# Patient Record
Sex: Male | Born: 1960
Health system: Southern US, Community
[De-identification: ages and names within clinical notes are randomized; demographics above are authoritative.]

## PROBLEM LIST (undated history)

## (undated) DIAGNOSIS — J45909 Unspecified asthma, uncomplicated: Secondary | ICD-10-CM

## (undated) HISTORY — PX: JOINT REPLACEMENT: SHX530

---

## 2003-06-05 ENCOUNTER — Ambulatory Visit (HOSPITAL_COMMUNITY): Admission: RE | Admit: 2003-06-05 | Discharge: 2003-06-05 | Payer: Self-pay | Admitting: Gastroenterology

## 2010-11-23 ENCOUNTER — Encounter: Payer: Self-pay | Admitting: Surgery

## 2016-01-31 ENCOUNTER — Emergency Department (HOSPITAL_BASED_OUTPATIENT_CLINIC_OR_DEPARTMENT_OTHER)
Admission: EM | Admit: 2016-01-31 | Discharge: 2016-01-31 | Disposition: A | Discharge status: Home / Self Care | Payer: BC Managed Care – PPO | Attending: Emergency Medicine | Admitting: Emergency Medicine

## 2016-01-31 ENCOUNTER — Emergency Department (HOSPITAL_BASED_OUTPATIENT_CLINIC_OR_DEPARTMENT_OTHER): Payer: BC Managed Care – PPO

## 2016-01-31 ENCOUNTER — Encounter (HOSPITAL_BASED_OUTPATIENT_CLINIC_OR_DEPARTMENT_OTHER): Payer: Self-pay | Admitting: Emergency Medicine

## 2016-01-31 DIAGNOSIS — J45901 Unspecified asthma with (acute) exacerbation: Secondary | ICD-10-CM | POA: Insufficient documentation

## 2016-01-31 DIAGNOSIS — Z7952 Long term (current) use of systemic steroids: Secondary | ICD-10-CM | POA: Insufficient documentation

## 2016-01-31 DIAGNOSIS — Z79899 Other long term (current) drug therapy: Secondary | ICD-10-CM | POA: Diagnosis not present

## 2016-01-31 DIAGNOSIS — Z7951 Long term (current) use of inhaled steroids: Secondary | ICD-10-CM | POA: Insufficient documentation

## 2016-01-31 DIAGNOSIS — R0602 Shortness of breath: Secondary | ICD-10-CM | POA: Diagnosis present

## 2016-01-31 DIAGNOSIS — R42 Dizziness and giddiness: Secondary | ICD-10-CM | POA: Insufficient documentation

## 2016-01-31 HISTORY — DX: Unspecified asthma, uncomplicated: J45.909

## 2016-01-31 LAB — BASIC METABOLIC PANEL
Anion gap: 10 (ref 5–15)
BUN: 25 mg/dL — AB (ref 6–20)
CO2: 21 mmol/L — ABNORMAL LOW (ref 22–32)
CREATININE: 0.93 mg/dL (ref 0.61–1.24)
Calcium: 9.1 mg/dL (ref 8.9–10.3)
Chloride: 106 mmol/L (ref 101–111)
GFR calc Af Amer: >60 mL/min (ref >60)
Glucose, Bld: 102 mg/dL — ABNORMAL HIGH (ref 65–99)
Potassium: 3.8 mmol/L (ref 3.5–5.1)
SODIUM: 137 mmol/L (ref 135–145)

## 2016-01-31 LAB — CBC
HCT: 44.3 % (ref 39.0–52.0)
Hemoglobin: 15.3 g/dL (ref 13.0–17.0)
MCH: 29.4 pg (ref 26.0–34.0)
MCHC: 34.5 g/dL (ref 30.0–36.0)
MCV: 85 fL (ref 78.0–100.0)
PLATELETS: 287 10*3/uL (ref 150–400)
RBC: 5.21 MIL/uL (ref 4.22–5.81)
RDW: 12.8 % (ref 11.5–15.5)
WBC: 13 10*3/uL — ABNORMAL HIGH (ref 4.0–10.5)

## 2016-01-31 LAB — TROPONIN I

## 2016-01-31 MED ORDER — ALBUTEROL (5 MG/ML) CONTINUOUS INHALATION SOLN
INHALATION_SOLUTION | RESPIRATORY_TRACT | Status: AC
  Administered 2016-01-31: 15 mg/h
  Filled 2016-01-31: qty 20

## 2016-01-31 MED ORDER — SODIUM CHLORIDE 0.9 % IV SOLN
INTRAVENOUS | Status: DC
  Administered 2016-01-31: 11:00:00 via INTRAVENOUS

## 2016-01-31 MED ORDER — PREDNISONE 10 MG PO TABS: 40.0000 mg | ORAL_TABLET | Freq: Every day | ORAL | Status: DC

## 2016-01-31 MED ORDER — IPRATROPIUM-ALBUTEROL 0.5-2.5 (3) MG/3ML IN SOLN
3.0000 mL | Freq: Four times a day (QID) | RESPIRATORY_TRACT | Status: DC
  Administered 2016-01-31: 3 mL via RESPIRATORY_TRACT
  Filled 2016-01-31: qty 3

## 2016-01-31 MED ORDER — METHYLPREDNISOLONE SODIUM SUCC 125 MG IJ SOLR
125.0000 mg | Freq: Once | INTRAMUSCULAR | Status: AC
  Administered 2016-01-31: 125 mg via INTRAVENOUS
  Filled 2016-01-31: qty 2

## 2016-01-31 MED FILL — predniSONE 10 MG TABS: 10 | 5 days supply | Qty: 20 | Fill #0

## 2016-01-31 NOTE — ED Notes (Signed)
[facility] brought from [facility], where patient was having a meeting and suddenly developed acute onset of sob, pt has been having inreased issues with Asthma due to pollen, wed seen at pcp started on steroids, albuterol and antibiotic

## 2016-01-31 NOTE — Discharge Instructions (Signed)
Asthma, Adult Asthma is a condition of the lungs in which the airways tighten and narrow. Asthma can make it hard to breathe. Asthma cannot be cured, but medicine and lifestyle changes can help control it. Asthma may be started (triggered) by:  Animal skin flakes (dander).  Dust.  Cockroaches.  Pollen.  Mold.  Smoke.  Cleaning products.  Hair sprays or aerosol sprays.  Paint fumes or strong smells.  Cold air, weather changes, and winds.  Crying or laughing hard.  Stress.  Certain medicines or drugs.  Foods, such as dried fruit, potato chips, and sparkling grape juice.  Infections or conditions (colds, flu).  Exercise.  Certain medical conditions or diseases.  Exercise or tiring activities. HOME CARE   Take medicine as told by your doctor.  Use a peak flow meter as told by your doctor. A peak flow meter is a tool that measures how well the lungs are working.  Record and keep track of the peak flow meter's readings.  Understand and use the asthma action plan. An asthma action plan is a written plan for taking care of your asthma and treating your attacks.  To help prevent asthma attacks:  Do not smoke. Stay away from secondhand smoke.  Change your heating and air conditioning filter often.  Limit your use of fireplaces and wood stoves.  Get rid of pests (such as roaches and mice) and their droppings.  Throw away plants if you see mold on them.  Clean your floors. Dust regularly. Use cleaning products that do not smell.  Have someone vacuum when you are not home. Use a vacuum cleaner with a HEPA filter if possible.  Replace carpet with wood, tile, or vinyl flooring. Carpet can trap animal skin flakes and dust.  Use allergy-proof pillows, mattress covers, and box spring covers.  Wash bed sheets and blankets every week in hot water and dry them in a dryer.  Use blankets that are made of polyester or cotton.  Clean bathrooms and kitchens with bleach.  If possible, have someone repaint the walls in these rooms with mold-resistant paint. Keep out of the rooms that are being cleaned and painted.  Wash hands often. GET HELP IF:  You have make a whistling sound when breaking (wheeze), have shortness of breath, or have a cough even if taking medicine to prevent attacks.  The colored mucus you cough up (sputum) is thicker than usual.  The colored mucus you cough up changes from clear or white to yellow, green, gray, or bloody.  You have problems from the medicine you are taking such as:  A rash.  Itching.  Swelling.  Trouble breathing.  You need reliever medicines more than 2-3 times a week.  Your peak flow measurement is still at 50-79% of your personal best after following the action plan for 1 hour.  You have a fever. GET HELP RIGHT AWAY IF:   You seem to be worse and are not responding to medicine during an asthma attack.  You are short of breath even at rest.  You get short of breath when doing very little activity.  You have trouble eating, drinking, or talking.  You have chest pain.  You have a fast heartbeat.  Your lips or fingernails start to turn blue.  You are light-headed, dizzy, or faint.  Your peak flow is less than 50% of your personal best.   This information is not intended to replace advice given to you by your health care provider. Make sure  you discuss any questions you have with your health care provider.  Use albuterol inhaler that you have at home 2 puffs every 6 hours for the next 7 days. Take prednisone as directed for the next 5 days. Make an appointment to follow-up with your regular doctor. Return for any new or worse symptoms.   Document Released: 04/06/2008 Document Revised: 07/10/2015 Document Reviewed: 05/18/2013 Elsevier Interactive Patient Education [employer].

## 2016-01-31 NOTE — ED Notes (Signed)
Ems did not give solumedrol en route secondary patient took dose of prednisone this am, according to patient he only took 5 mg. 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

## 2016-01-31 NOTE — ED Notes (Signed)
Family at bedside. 

## 2016-01-31 NOTE — ED Provider Notes (Signed)
CSN: [id number]     Arrival date & time 01/31/16  [id number] History   First MD Initiated Contact with Patient 01/31/16 [phone]     Chief Complaint  Patient presents with  . Shortness of Breath     (Consider location/radiation/quality/duration/timing/severity/associated sxs/prior Treatment) Patient is a 55 y.o. male presenting with shortness of breath. The history is provided by the patient, the spouse and the EMS personnel.  Shortness of Breath Associated symptoms: wheezing   Associated symptoms: no abdominal pain, no chest pain and no vomiting    D86-year-old male brought in by EMS for acute exacerbation of shortness of breath and wheezing while at work at [employer]. Patient given albuterol nebulizer in route. Patient still very short of breath upon arrival with bilateral wheezing. Patient has a history of allergies and asthma. Patient's just finishing up a steroid taper for asthma exacerbation of about a week and a half ago. Patient also chest x-ray at that time that was negative. Patient uses albuterol inhaler at home only 1 puff as needed.  Past Medical History  Diagnosis Date  . Asthma    Past Surgical History  Procedure Laterality Date  . Joint replacement     History reviewed. No pertinent family history. Social History  Substance Use Topics  . Smoking status: Never Smoker   . Smokeless tobacco: None  . Alcohol Use: No    Review of Systems  Constitutional: Positive for fatigue.  HENT: Positive for congestion.   Eyes: Negative for visual disturbance.  Respiratory: Positive for shortness of breath and wheezing.   Cardiovascular: Negative for chest pain.  Gastrointestinal: Negative for nausea, vomiting and abdominal pain.  Genitourinary: Negative for dysuria.  Musculoskeletal: Negative for back pain.  Neurological: Positive for light-headedness. Negative for speech difficulty.  Hematological: Does not bruise/bleed easily.  Psychiatric/Behavioral: Negative for  confusion.      Allergies  Review of patient's allergies indicates no known allergies.  Home Medications   Prior to Admission medications   Medication Sig Start Date End Date Taking? Authorizing Provider  cetirizine (ZYRTEC) 10 MG tablet Take 10 mg by mouth daily.   Yes Historical Provider, MD  fluticasone (FLOVENT HFA) 110 MCG/ACT inhaler Inhale into the lungs 2 (two) times daily.   Yes Historical Provider, MD  predniSONE (STERAPRED UNI-PAK 21 TAB) 10 MG (21) TBPK tablet Take 10 mg by mouth daily.   Yes Historical Provider, MD  UNKNOWN TO PATIENT    Yes Historical Provider, MD  predniSONE (DELTASONE) 10 MG tablet Take 4 tablets (40 mg total) by mouth daily. 01/31/16   Fredia Sorrow, MD   BP 138/76 mmHg  Pulse 89  Temp(Src) 98.2 F (36.8 C) (Oral)  Resp 16  Ht 6\' 2"  (1.88 m)  Wt 104.327 kg  BMI 29.52 kg/m2  SpO2 97% Physical Exam  Constitutional: He is oriented to person, place, and time. He appears well-developed and well-nourished. No distress.  HENT:  Head: Normocephalic and atraumatic.  Mouth/Throat: Oropharynx is clear and moist.  Eyes: Conjunctivae and EOM are normal. Pupils are equal, round, and reactive to light.  Neck: Normal range of motion. Neck supple.  Cardiovascular: Normal rate, regular rhythm, normal heart sounds and intact distal pulses.   Pulmonary/Chest: He is in respiratory distress. He has wheezes. He has no rales.  Abdominal: Soft. Bowel sounds are normal. There is no tenderness.  Musculoskeletal: He exhibits no edema.  Neurological: He is alert and oriented to person, place, and time. No cranial nerve deficit. He exhibits  normal muscle tone. Coordination normal.  Skin: Skin is warm. No rash noted.  Nursing note and vitals reviewed.   ED Course  Procedures (including critical care time) Labs Review Labs Reviewed  CBC - Abnormal; Notable for the following:    WBC 13.0 (*)    All other components within normal limits  BASIC METABOLIC PANEL -  Abnormal; Notable for the following:    CO2 21 (*)    Glucose, Bld 102 (*)    BUN 25 (*)    All other components within normal limits  TROPONIN I   Results for orders placed or performed during the hospital encounter of 01/31/16  CBC  Result Value Ref Range   WBC 13.0 (H) 4.0 - 10.5 K/uL   RBC 5.21 4.22 - 5.81 MIL/uL   Hemoglobin 15.3 13.0 - 17.0 g/dL   HCT 44.3 39.0 - 52.0 %   MCV 85.0 78.0 - 100.0 fL   MCH 29.4 26.0 - 34.0 pg   MCHC 34.5 30.0 - 36.0 g/dL   RDW 12.8 11.5 - 15.5 %   Platelets 287 150 - 400 K/uL  Basic metabolic panel  Result Value Ref Range   Sodium 137 135 - 145 mmol/L   Potassium 3.8 3.5 - 5.1 mmol/L   Chloride 106 101 - 111 mmol/L   CO2 21 (L) 22 - 32 mmol/L   Glucose, Bld 102 (H) 65 - 99 mg/dL   BUN 25 (H) 6 - 20 mg/dL   Creatinine, Ser 0.93 0.61 - 1.24 mg/dL   Calcium 9.1 8.9 - 10.3 mg/dL   GFR calc non Af Amer >60 >60 mL/min   GFR calc Af Amer >60 >60 mL/min   Anion gap 10 5 - 15  Troponin I  Result Value Ref Range   Troponin I <0.03 <0.031 ng/mL     Imaging Review Dg Chest 2 View  01/31/2016  CLINICAL DATA:  Shortness of breath, wheezing EXAM: CHEST  2 VIEW COMPARISON:  None. FINDINGS: Cardiomediastinal silhouette is unremarkable. No acute infiltrate or pleural effusion. No pulmonary edema. Bony thorax is unremarkable. IMPRESSION: No active cardiopulmonary disease. Electronically Signed   By: Lahoma Crocker M.D.   On: 01/31/2016 10:03   I have personally reviewed and evaluated these images and lab results as part of my medical decision-making.   EKG Interpretation   Date/Time:  Friday January 31 2016 09:31:39 EDT Ventricular Rate:  65 PR Interval:  119 QRS Duration: 96 QT Interval:  411 QTC Calculation: 427 R Axis:   37 Text Interpretation:  Sinus rhythm Borderline short PR interval Abnormal  R-wave progression, early transition Minimal ST elevation, inferior leads  No previous ECGs available ] Confirmed by Diana Armijo  MD, Christiano Blandon [phone]) on   01/31/2016 10:07:15 AM      MDM   Final diagnoses:  Asthma, unspecified asthma severity, with acute exacerbation    Patient with known history of asthma and allergies. Patient with an acute exacerbation of wheezing and shortness of breath today. Patient is just finishing up a steroid taper. Patient uses an albuterol inhaler at home. EMS gave him albuterol nebulizer in route. Do not give any steroids. Here we repeated the albuterol nebulizer with Atrovent. And I gave a dose of 125 mg Solu-Medrol. Patient still wheezing after this. Patient given one-hour long belies her wheezing has resolved patient feels much better. Oxygen saturations are in the upper 90s and always have been. Patient feeling much better. Chest x-ray negative for pneumonia. Labs including a troponin without  any acute findings. Slight leukocytosis on the white blood cell count. This probably due to steroids.    Fredia Sorrow, MD 01/31/16 [phone]

## 2017-02-01 ENCOUNTER — Other Ambulatory Visit: Payer: Self-pay | Admitting: Allergy and Immunology

## 2017-02-01 ENCOUNTER — Ambulatory Visit
Admission: RE | Admit: 2017-02-01 | Discharge: 2017-02-01 | Disposition: A | Discharge status: Home / Self Care | Payer: BC Managed Care – PPO | Source: Ambulatory Visit | Attending: Allergy and Immunology | Admitting: Allergy and Immunology

## 2017-02-01 DIAGNOSIS — J209 Acute bronchitis, unspecified: Secondary | ICD-10-CM

## 2017-02-18 ENCOUNTER — Ambulatory Visit
Admission: RE | Admit: 2017-02-18 | Discharge: 2017-02-18 | Disposition: A | Discharge status: Home / Self Care | Payer: BC Managed Care – PPO | Source: Ambulatory Visit | Attending: Allergy and Immunology | Admitting: Allergy and Immunology

## 2017-02-18 ENCOUNTER — Other Ambulatory Visit: Payer: Self-pay | Admitting: Allergy and Immunology

## 2017-02-18 DIAGNOSIS — J4541 Moderate persistent asthma with (acute) exacerbation: Secondary | ICD-10-CM

## 2017-02-19 ENCOUNTER — Telehealth: Payer: Self-pay | Admitting: *Deleted

## 2017-02-19 NOTE — Telephone Encounter (Signed)
NOTES SENT TO SCHEDULING.  °

## 2017-03-01 ENCOUNTER — Emergency Department (HOSPITAL_BASED_OUTPATIENT_CLINIC_OR_DEPARTMENT_OTHER): Payer: BC Managed Care – PPO

## 2017-03-01 ENCOUNTER — Emergency Department (HOSPITAL_BASED_OUTPATIENT_CLINIC_OR_DEPARTMENT_OTHER)
Admission: EM | Admit: 2017-03-01 | Discharge: 2017-03-01 | Disposition: A | Discharge status: Home / Self Care | Payer: BC Managed Care – PPO | Attending: Emergency Medicine | Admitting: Emergency Medicine

## 2017-03-01 ENCOUNTER — Encounter (HOSPITAL_BASED_OUTPATIENT_CLINIC_OR_DEPARTMENT_OTHER): Payer: Self-pay | Admitting: *Deleted

## 2017-03-01 DIAGNOSIS — R0602 Shortness of breath: Secondary | ICD-10-CM | POA: Diagnosis present

## 2017-03-01 DIAGNOSIS — Z79899 Other long term (current) drug therapy: Secondary | ICD-10-CM | POA: Insufficient documentation

## 2017-03-01 DIAGNOSIS — J45901 Unspecified asthma with (acute) exacerbation: Secondary | ICD-10-CM | POA: Diagnosis not present

## 2017-03-01 LAB — BASIC METABOLIC PANEL
Anion gap: 12 (ref 5–15)
BUN: 19 mg/dL (ref 6–20)
CO2: 23 mmol/L (ref 22–32)
Calcium: 9.1 mg/dL (ref 8.9–10.3)
Chloride: 104 mmol/L (ref 101–111)
Creatinine, Ser: 1.01 mg/dL (ref 0.61–1.24)
GFR calc Af Amer: >60 mL/min (ref >60)
Glucose, Bld: 100 mg/dL — ABNORMAL HIGH (ref 65–99)
Potassium: 3.2 mmol/L — ABNORMAL LOW (ref 3.5–5.1)
SODIUM: 139 mmol/L (ref 135–145)

## 2017-03-01 LAB — CBC WITH DIFFERENTIAL/PLATELET
BASOS ABS: 0 10*3/uL (ref 0.0–0.1)
BASOS PCT: 0 %
EOS ABS: 0 10*3/uL (ref 0.0–0.7)
Eosinophils Relative: 0 %
HCT: 45.5 % (ref 39.0–52.0)
Hemoglobin: 15.7 g/dL (ref 13.0–17.0)
Lymphocytes Relative: 20 %
Lymphs Abs: 3.4 10*3/uL (ref 0.7–4.0)
MCH: 29.8 pg (ref 26.0–34.0)
MCHC: 34.5 g/dL (ref 30.0–36.0)
MCV: 86.3 fL (ref 78.0–100.0)
Monocytes Absolute: 1.1 10*3/uL — ABNORMAL HIGH (ref 0.1–1.0)
Monocytes Relative: 6 %
NEUTROS PCT: 74 %
Neutro Abs: 12.8 10*3/uL — ABNORMAL HIGH (ref 1.7–7.7)
PLATELETS: 252 10*3/uL (ref 150–400)
RBC: 5.27 MIL/uL (ref 4.22–5.81)
RDW: 13.7 % (ref 11.5–15.5)
WBC: 17.4 10*3/uL — AB (ref 4.0–10.5)

## 2017-03-01 LAB — TROPONIN I

## 2017-03-01 LAB — D-DIMER, QUANTITATIVE: D-Dimer, Quant: 0.37 ug/mL-FEU (ref 0.00–0.50)

## 2017-03-01 MED ORDER — PREDNISONE 20 MG PO TABS: ORAL_TABLET | ORAL | 0 refills | Status: DC

## 2017-03-01 MED ORDER — TIOTROPIUM BROMIDE MONOHYDRATE 1.25 MCG/ACT IN AERS: 1.2500 ug | INHALATION_SPRAY | Freq: Every day | RESPIRATORY_TRACT | 0 refills | Status: DC

## 2017-03-01 MED ORDER — PREDNISONE 50 MG PO TABS
60.0000 mg | ORAL_TABLET | Freq: Once | ORAL | Status: DC
  Filled 2017-03-01: qty 1

## 2017-03-01 MED ORDER — IPRATROPIUM-ALBUTEROL 0.5-2.5 (3) MG/3ML IN SOLN
3.0000 mL | RESPIRATORY_TRACT | Status: AC
  Administered 2017-03-01 (×3): 3 mL via RESPIRATORY_TRACT
  Filled 2017-03-01: qty 6
  Filled 2017-03-01: qty 3

## 2017-03-01 NOTE — ED Notes (Signed)
Into dc patient, patient not in room, EMT previously removed IV access, discharge papers not available - Dr. Dayna Barker states he reviewed discharge plan with patient - discharge papers and pt not available for RN review at discharge.

## 2017-03-01 NOTE — ED Notes (Signed)
Family at bedside. 

## 2017-03-01 NOTE — ED Triage Notes (Signed)
Dr. Dolly Rias at bedside to assess pt during triage. Pt's wife reports pt has a history of asthma and has recently had pneumonia. He became SOB at work and was brought here.

## 2017-03-01 NOTE — ED Provider Notes (Signed)
[facility] DEPT Provider Note   CSN: [id number] Arrival date & time: 03/01/17  1218     History   Chief Complaint Chief Complaint  Patient presents with  . Shortness of Breath    HPI Noah Carrillo is a 56 y.o. male.   Shortness of Breath  This is a recurrent problem. The average episode lasts 2 hours. The problem occurs continuously.The problem has been gradually worsening. Associated symptoms include cough and chest pain. Pertinent negatives include no fever. The problem's precipitants include pollens. He has tried nothing for the symptoms. Associated medical issues include asthma.    Past Medical History:  Diagnosis Date  . Asthma     There are no active problems to display for this patient.   No past surgical history on file.     Home Medications    Prior to Admission medications   Medication Sig Start Date End Date Taking? Authorizing Provider  cetirizine (ZYRTEC) 10 MG tablet Take 10 mg by mouth daily.   Yes Historical Provider, MD  fluticasone (FLOVENT HFA) 110 MCG/ACT inhaler Inhale into the lungs 2 (two) times daily.   Yes Historical Provider, MD  predniSONE (DELTASONE) 20 MG tablet 3 tabs po daily x 3 days, then 2 tabs x 3 days, then 1.5 tabs x 3 days, then 1 tab x 3 days, then 0.5 tabs x 3 days 03/01/17   Merrily Pew, MD  Tiotropium Bromide Monohydrate (SPIRIVA RESPIMAT) 1.25 MCG/ACT AERS Inhale 1.25 mcg into the lungs daily. 03/01/17   Merrily Pew, MD  UNKNOWN TO PATIENT     Historical Provider, MD    Family History No family history on file.  Social History Social History  Substance Use Topics  . Smoking status: Never Smoker  . Smokeless tobacco: Never Used  . Alcohol use No     Allergies   Patient has no known allergies.   Review of Systems Review of Systems  Constitutional: Negative for fever.  Respiratory: Positive for cough and shortness of breath.   Cardiovascular: Positive for chest pain.  All other systems reviewed and are  negative.    Physical Exam Updated Vital Signs BP (!) 146/70   Pulse 81   Temp 98.1 F (36.7 C) (Oral)   Resp 10   SpO2 97%   Physical Exam  Constitutional: He appears well-developed and well-nourished. No distress.  HENT:  Head: Normocephalic and atraumatic.  Eyes: Conjunctivae and EOM are normal.  Neck: Normal range of motion.  Cardiovascular: Normal rate.   Pulmonary/Chest: Effort normal. No respiratory distress.  Abdominal: Soft. He exhibits no distension.  Musculoskeletal: Normal range of motion.  Neurological: He is alert.  Nursing note and vitals reviewed.    ED Treatments / Results  Labs (all labs ordered are listed, but only abnormal results are displayed) Labs Reviewed  CBC WITH DIFFERENTIAL/PLATELET - Abnormal; Notable for the following:       Result Value   WBC 17.4 (*)    Neutro Abs 12.8 (*)    Monocytes Absolute 1.1 (*)    All other components within normal limits  BASIC METABOLIC PANEL - Abnormal; Notable for the following:    Potassium 3.2 (*)    Glucose, Bld 100 (*)    All other components within normal limits  TROPONIN I  D-DIMER, QUANTITATIVE (NOT AT [facility])    EKG  EKG Interpretation  Date/Time:  Monday March 01 2017 13:17:58 EDT Ventricular Rate:  83 PR Interval:    QRS Duration: 97 QT Interval:  384 QTC Calculation: 452 R Axis:   12 Text Interpretation:  Sinus rhythm No significant change since last tracing Confirmed by [facility] MD, Jinnifer Montejano [phone]) on 03/01/2017 2:02:10 PM       Radiology Dg Chest Portable 1 View  Result Date: 03/01/2017 CLINICAL DATA:  Onset shortness of breath today. EXAM: PORTABLE CHEST 1 VIEW COMPARISON:  PA and lateral chest 02/18/2017 and 02/01/2017. FINDINGS: The lungs are clear. Heart size is normal. No pneumothorax or pleural effusion. No acute bony abnormality. IMPRESSION: No acute disease. Electronically Signed   By: Inge Rise M.D.   On: 03/01/2017 12:59    Procedures Procedures (including critical  care time)  Medications Ordered in ED Medications  ipratropium-albuterol (DUONEB) 0.5-2.5 (3) MG/3ML nebulizer solution 3 mL (3 mLs Nebulization Given 03/01/17 1325)     Initial Impression / Assessment and Plan / ED Course  I have reviewed the triage vital signs and the nursing notes.  Pertinent labs & imaging results that were available during my care of the patient were reviewed by me and considered in my medical decision making (see chart for details).     Patient with apparent asthma attack that seemed to improve with duo nebs and steroids. I discussed with his allergist, Dr. Fredderick Phenix, who recommends starting Spiriva and doing prolonged prednisone taper. He says he will see him in the office then this week in the next week and the patient should call for an appointment. Patient with subjective improvement after breathing treatments. Able to ambulate without dyspnea. Stable for discharge.  Final Clinical Impressions(s) / ED Diagnoses   Final diagnoses:  Exacerbation of asthma, unspecified asthma severity, unspecified whether persistent    New Prescriptions Discharge Medication List as of 03/01/2017  2:49 PM    START taking these medications   Details  Tiotropium Bromide Monohydrate (SPIRIVA RESPIMAT) 1.25 MCG/ACT AERS Inhale 1.25 mcg into the lungs daily., Starting Mon 03/01/2017, Print         Merrily Pew, MD 03/02/17 [phone]

## 2017-03-01 NOTE — ED Notes (Signed)
Xray at bedside for portable chest 

## 2017-03-01 NOTE — ED Notes (Signed)
RT at bedside assessing patient  

## 2017-12-01 IMAGING — CR DG CHEST 2V
2 series · 2 of 2 positions shown · non-contrast
Comparison: 02/01/2017 chest radiograph.

CLINICAL DATA: Moderate persistent asthma

EXAM:
CHEST  2 VIEW

[w chest pa]
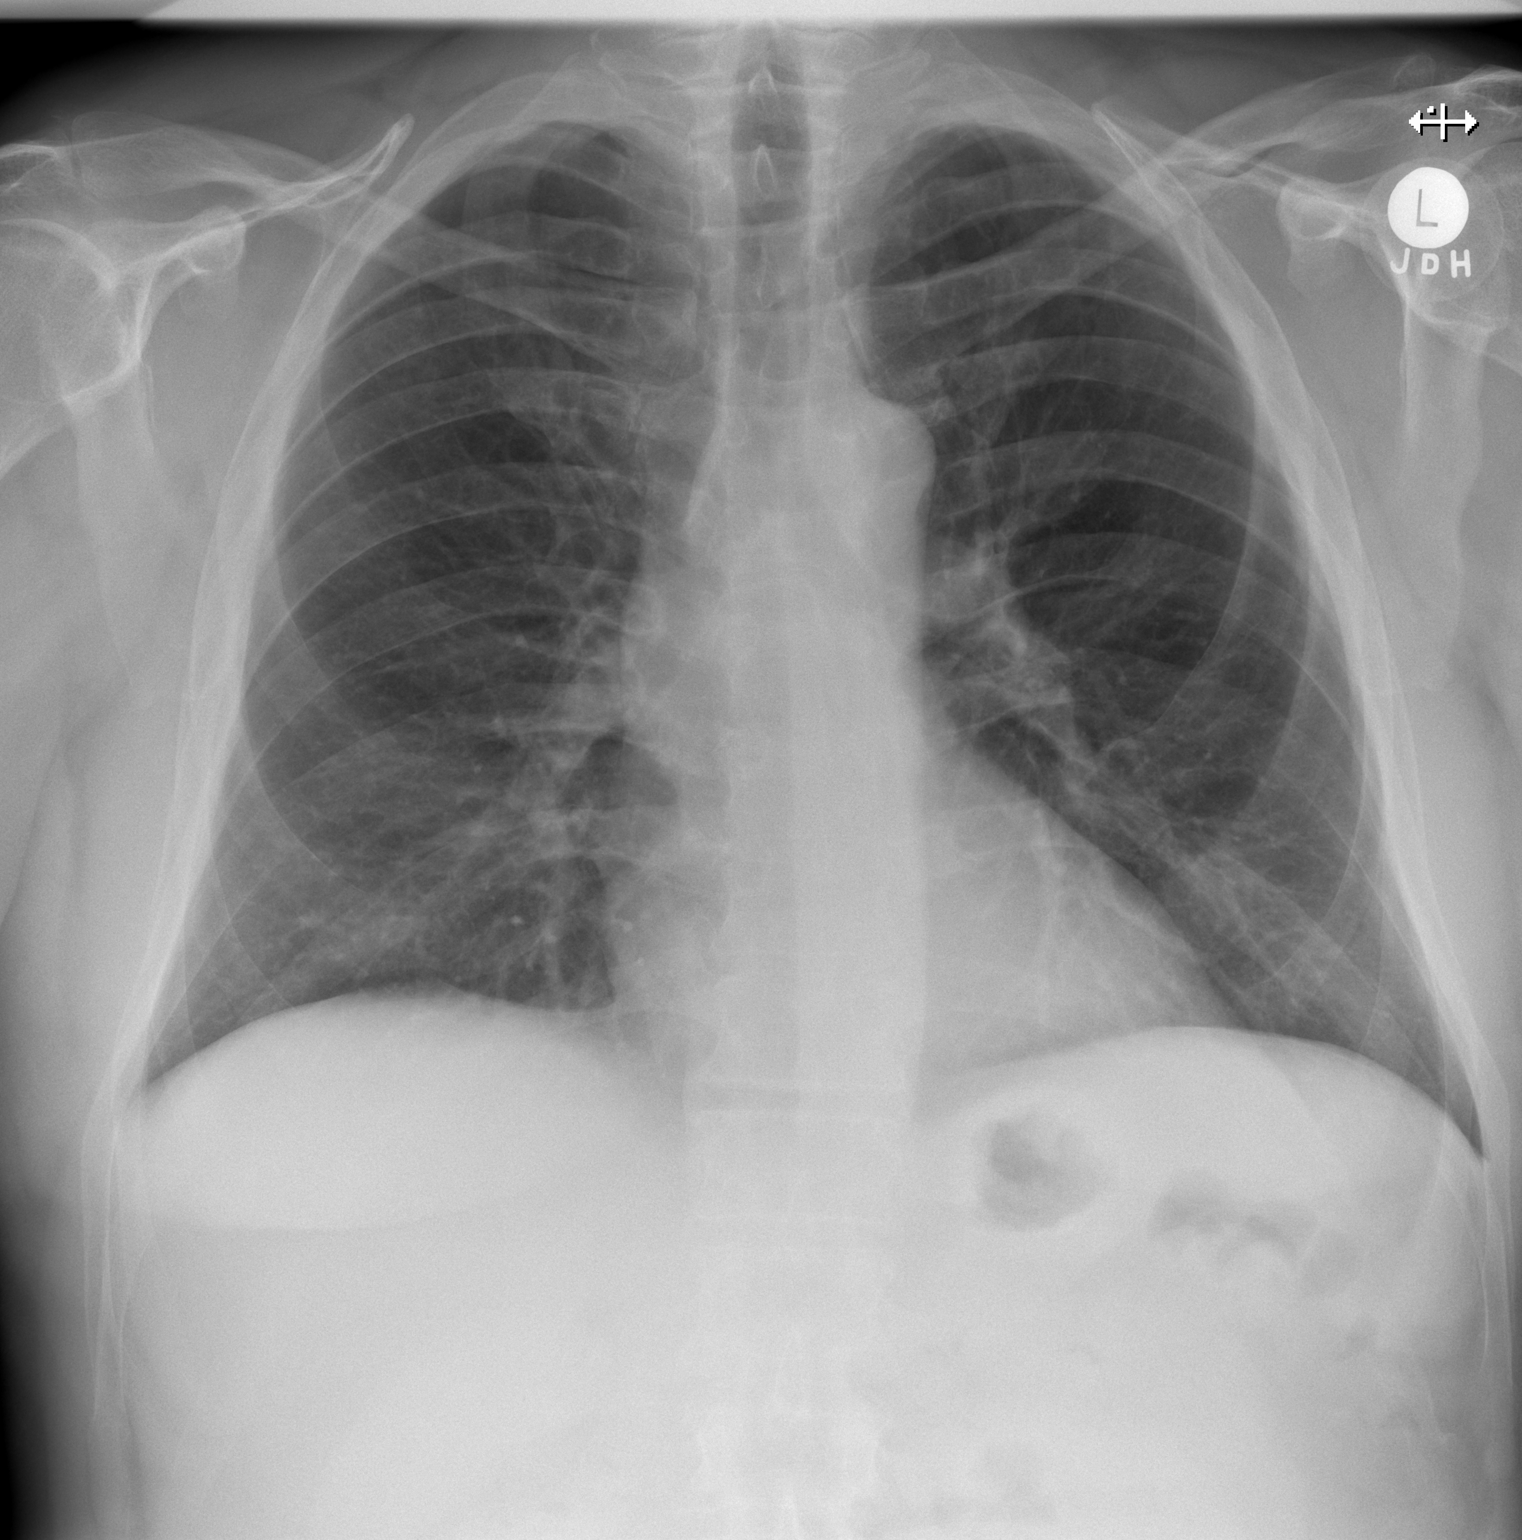

[w chest lat]
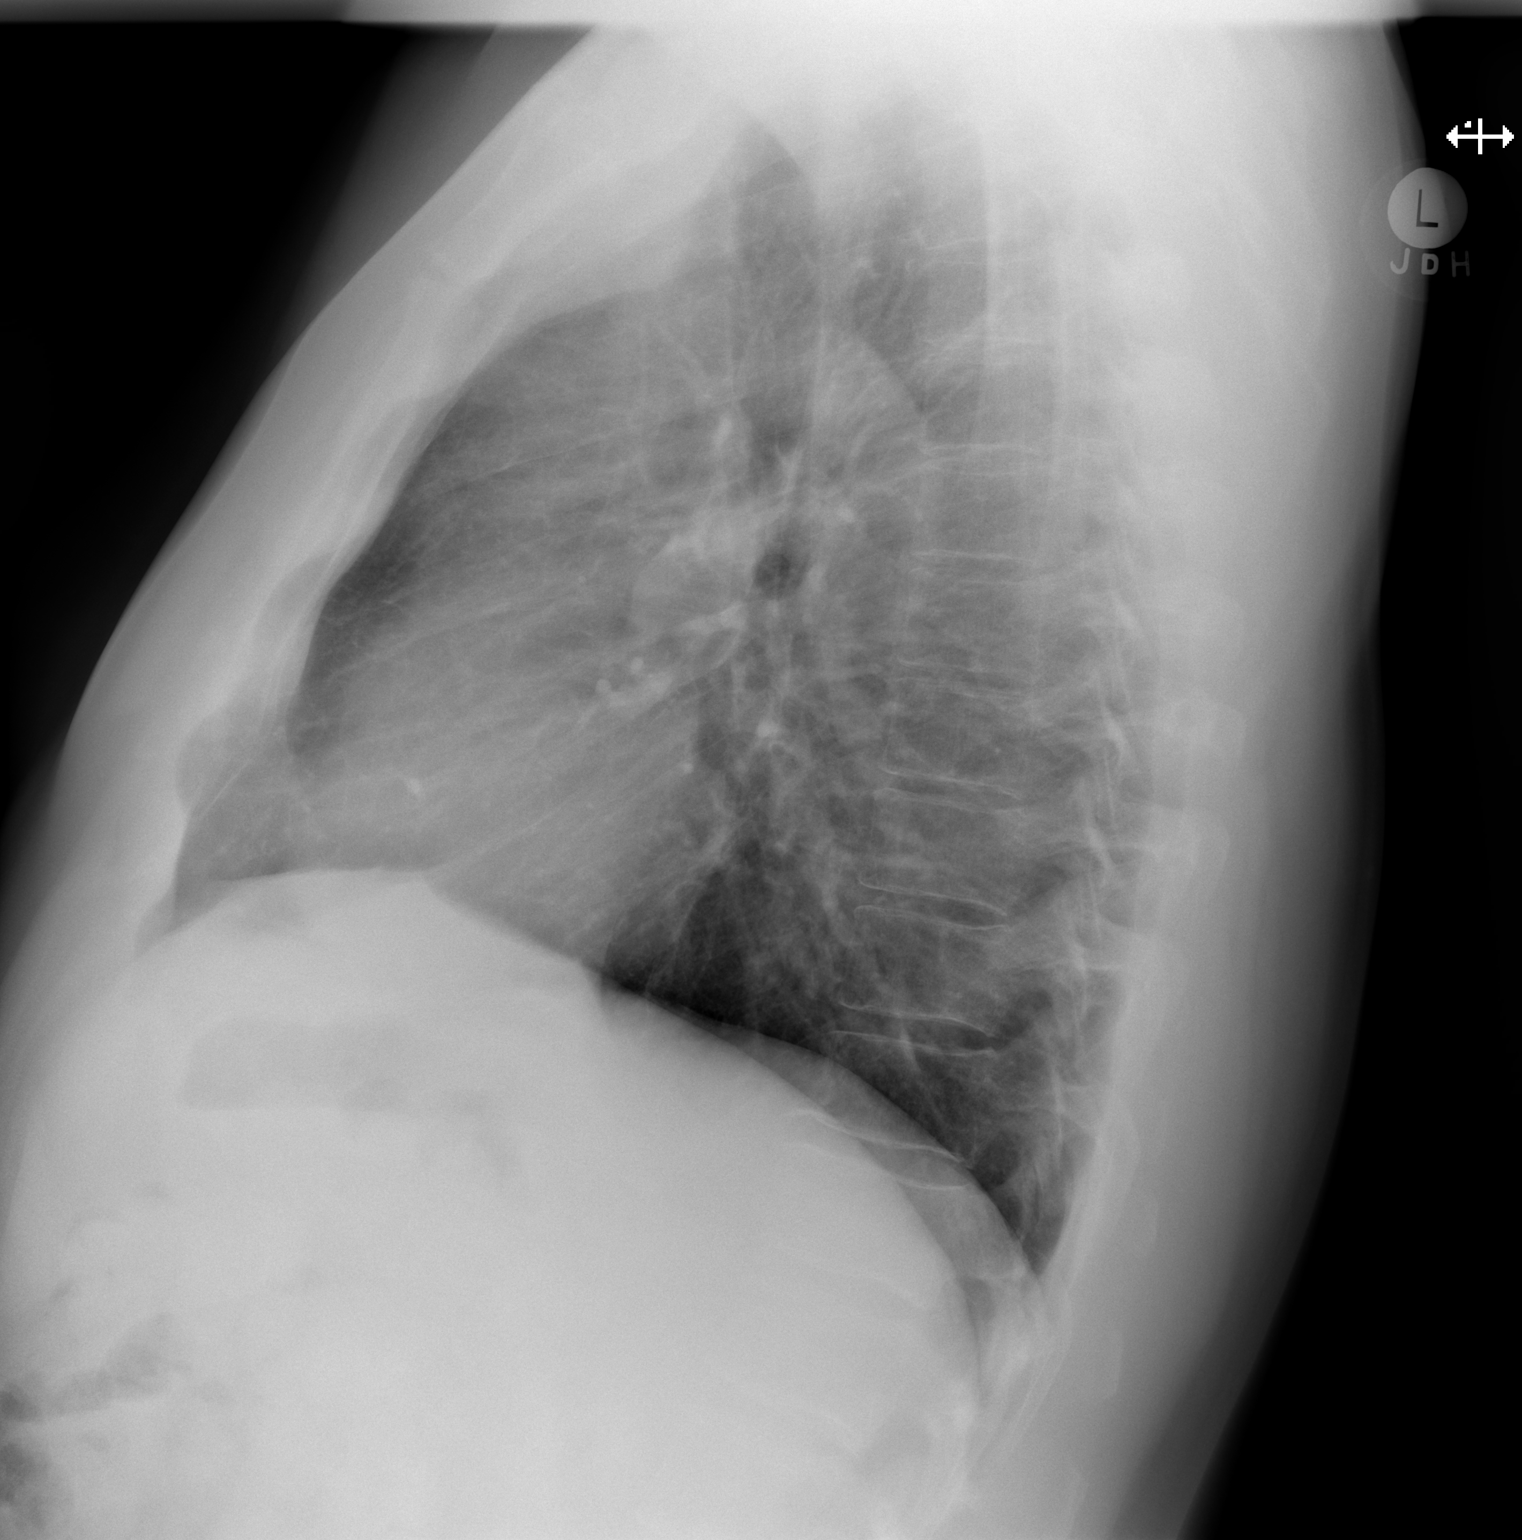

[2 of 2 positions shown; findings below may reference images not displayed]

FINDINGS: Stable cardiomediastinal silhouette with normal heart size. No
pneumothorax. No pleural effusion. No pulmonary edema. No
consolidative airspace disease. Previously described right lower
lung opacity is absent on this scan.
IMPRESSION: No active cardiopulmonary disease.

## 2017-12-12 IMAGING — DX DG CHEST 1V PORT
1 series · 1 of 1 positions shown · non-contrast
Comparison: PA and lateral chest 02/18/2017 and 02/01/2017.

CLINICAL DATA: Onset shortness of breath today.

EXAM:
PORTABLE CHEST 1 VIEW

[chest ap]
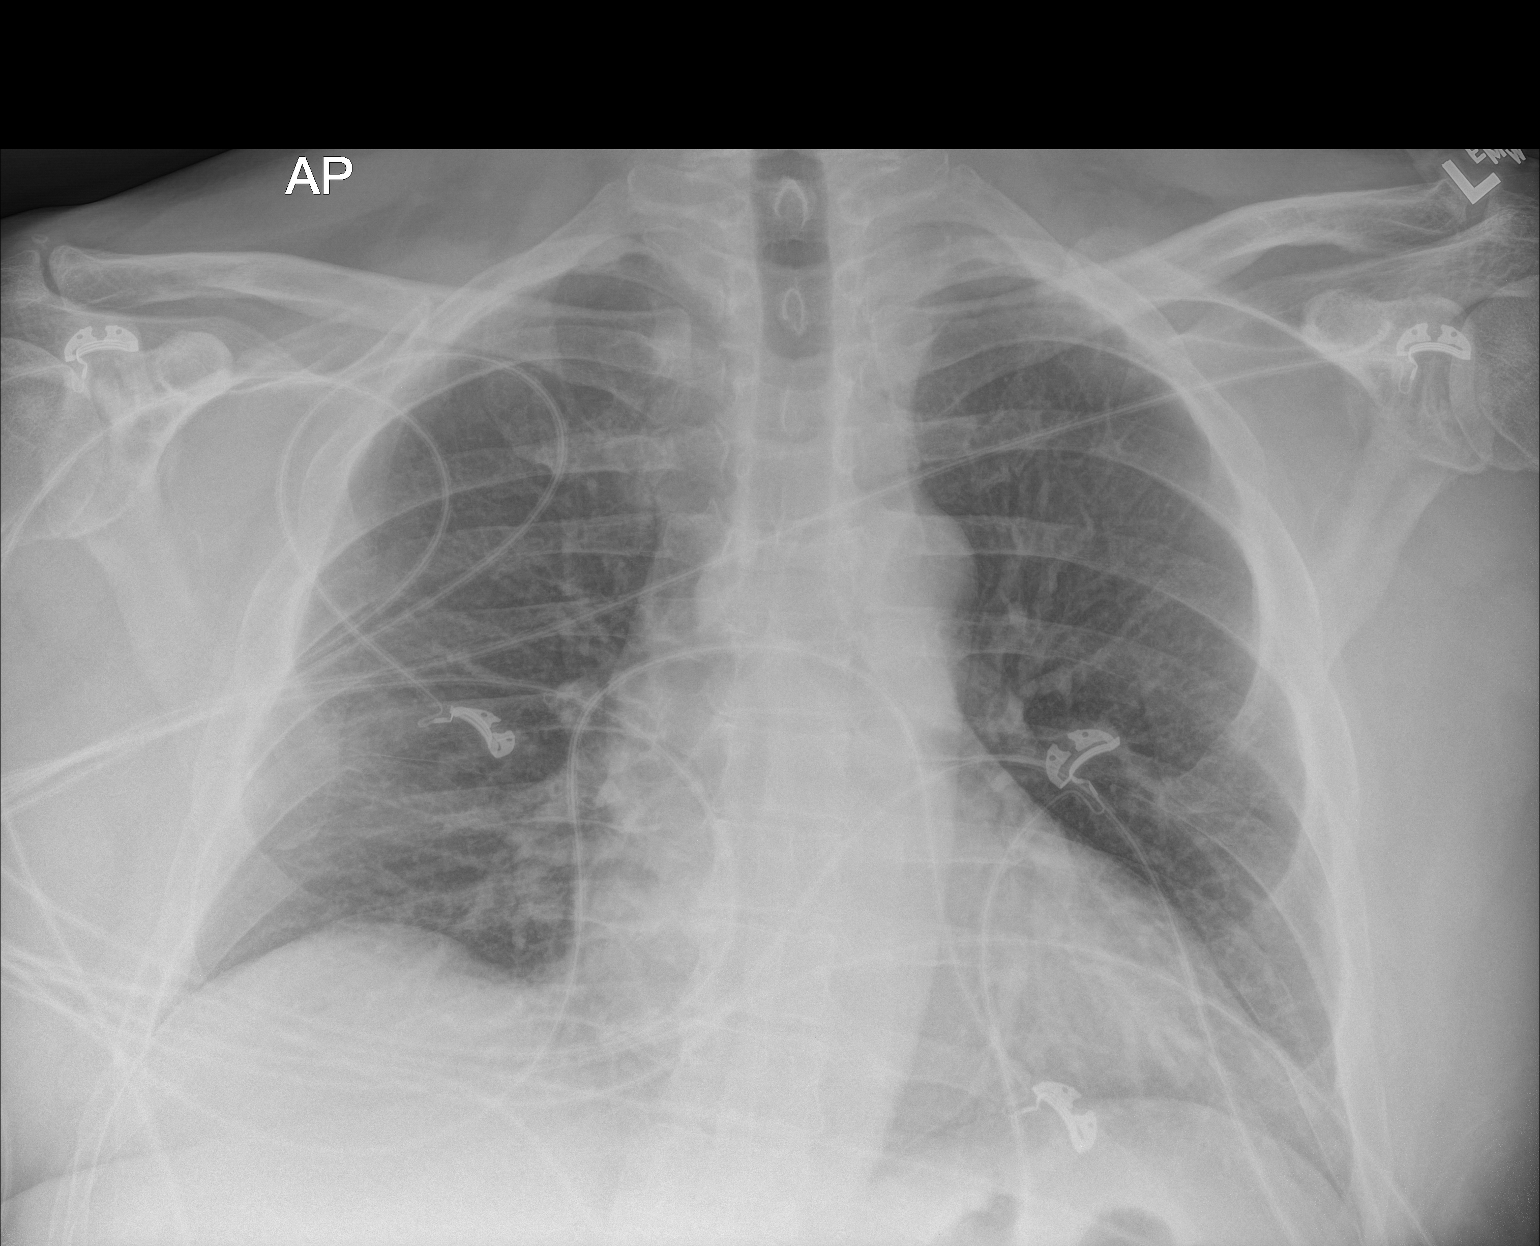

[1 of 1 positions shown; findings below may reference images not displayed]

FINDINGS: The lungs are clear. Heart size is normal. No pneumothorax or
pleural effusion. No acute bony abnormality.
IMPRESSION: No acute disease.

## 2020-07-18 ENCOUNTER — Ambulatory Visit: Payer: BC Managed Care – PPO | Admitting: Physician Assistant

## 2020-07-18 ENCOUNTER — Other Ambulatory Visit: Payer: Self-pay

## 2020-07-18 ENCOUNTER — Encounter: Payer: Self-pay | Admitting: Physician Assistant

## 2020-07-18 DIAGNOSIS — L570 Actinic keratosis: Secondary | ICD-10-CM | POA: Diagnosis not present

## 2020-07-18 DIAGNOSIS — Z1283 Encounter for screening for malignant neoplasm of skin: Secondary | ICD-10-CM | POA: Diagnosis not present

## 2020-07-18 NOTE — Progress Notes (Signed)
   Follow up Visit  Subjective  Noah Carrillo is a 59 y.o. male who presents for the following: Annual Exam (No concerns.). Scaling areas on face have improved after 5-fu use last winter. Scaling areas near right and left.   Objective  Well appearing patient in no apparent distress; mood and affect are within normal limits.  All skin waist up examined. No suspicious moles noted on back.   Objective  Mid Back: Upper body skin exam  Objective  Left Temple, Right Forehead, Right Temporal Scalp: Erythematous patches with gritty scale.  Assessment & Plan  Screening for malignant neoplasm of skin Mid Back  AK (actinic keratosis) (3) Left Temple; Right Forehead; Right Temporal Scalp  He will wait 1 months and then treat his forehead and temples and left earlobe with the 8fu cream.  Destruction of lesion - Left Temple, Right Forehead, Right Temporal Scalp Complexity: simple   Destruction method: cryotherapy   Informed consent: discussed and consent obtained   Timeout:  patient name, date of birth, surgical site, and procedure verified Lesion destroyed using liquid nitrogen: Yes   Outcome: patient tolerated procedure well with no complications

## 2020-10-16 ENCOUNTER — Ambulatory Visit: Payer: BC Managed Care – PPO | Admitting: Physician Assistant

## 2020-11-13 ENCOUNTER — Ambulatory Visit: Payer: BC Managed Care – PPO | Admitting: Dermatology

## 2020-12-05 ENCOUNTER — Encounter: Payer: Self-pay | Admitting: Dermatology

## 2020-12-05 ENCOUNTER — Other Ambulatory Visit: Payer: Self-pay

## 2020-12-05 ENCOUNTER — Ambulatory Visit: Payer: BC Managed Care – PPO | Admitting: Dermatology

## 2020-12-05 DIAGNOSIS — L570 Actinic keratosis: Secondary | ICD-10-CM | POA: Diagnosis not present

## 2020-12-05 DIAGNOSIS — L739 Follicular disorder, unspecified: Secondary | ICD-10-CM | POA: Diagnosis not present

## 2020-12-05 DIAGNOSIS — D2239 Melanocytic nevi of other parts of face: Secondary | ICD-10-CM | POA: Diagnosis not present

## 2020-12-14 ENCOUNTER — Encounter: Payer: Self-pay | Admitting: Dermatology

## 2020-12-14 NOTE — Progress Notes (Signed)
   Follow-Up Visit   Subjective  Noah Carrillo is a 60 y.o. male who presents for the following: Follow-up (Patient here today for 3 month follow up from spots previously LN2 on left temple, right forehead and right temporal scalp.  Per patient healed well, per patient he has a few spots on his neck that he would like checked. Per patient he just noticed the spots on his neck, no bleeding.).  Recheck crusts that were frozen and several other lesions. Location:  Duration:  Quality:  Associated Signs/Symptoms: Modifying Factors:  Severity:  Timing: Context:   Objective  Well appearing patient in no apparent distress; mood and affect are within normal limits. Objective  Mid Tip of Nose: 2 mm domed flesh-colored papule; dermoscopy does not show vascular changes of BCC.  Objective  Left Temple, Right Posterior Neck, Right Temple: 2 to 3 mm flat pink crusts  Objective  Right Occipital Scalp: Two mildly inflamed historically recent onset pink papules on neck; nonspecific but folliculitis more likely than neoplasm.    A focused examination was performed including Head and neck.. Relevant physical exam findings are noted in the Assessment and Plan.   Assessment & Plan    Fibrous papule of nose Mid Tip of Nose  Benign okay to leave unless there is growth or bleeding.  AK (actinic keratosis) (3) Left Temple; Right Temple; Right Posterior Neck  Destruction of lesion - Left Temple, Right Posterior Neck, Right Temple Complexity: simple   Destruction method: cryotherapy   Informed consent: discussed and consent obtained   Timeout:  patient name, date of birth, surgical site, and procedure verified Lesion destroyed using liquid nitrogen: Yes   Cryotherapy cycles:  5 Outcome: patient tolerated procedure well with no complications   Post-procedure details: wound care instructions given    Folliculitis Right Occipital Scalp  Return if persists/grows to consider  biopsy.      I, Lavonna Monarch, MD, have reviewed all documentation for this visit.  The documentation on 12/14/20 for the exam, diagnosis, procedures, and orders are all accurate and complete.

## 2021-12-15 ENCOUNTER — Ambulatory Visit: Payer: BC Managed Care – PPO | Admitting: Dermatology

## 2021-12-15 ENCOUNTER — Encounter: Payer: Self-pay | Admitting: Dermatology

## 2021-12-15 ENCOUNTER — Other Ambulatory Visit: Payer: Self-pay

## 2021-12-15 DIAGNOSIS — Z1283 Encounter for screening for malignant neoplasm of skin: Secondary | ICD-10-CM

## 2021-12-15 DIAGNOSIS — D3617 Benign neoplasm of peripheral nerves and autonomic nervous system of trunk, unspecified: Secondary | ICD-10-CM | POA: Diagnosis not present

## 2021-12-15 DIAGNOSIS — D1801 Hemangioma of skin and subcutaneous tissue: Secondary | ICD-10-CM | POA: Diagnosis not present

## 2021-12-15 DIAGNOSIS — L570 Actinic keratosis: Secondary | ICD-10-CM

## 2021-12-15 DIAGNOSIS — D3610 Benign neoplasm of peripheral nerves and autonomic nervous system, unspecified: Secondary | ICD-10-CM

## 2021-12-18 ENCOUNTER — Encounter: Payer: Self-pay | Admitting: Dermatology

## 2021-12-18 NOTE — Progress Notes (Signed)
° °  Follow-Up Visit   Subjective  Noah Carrillo is a 61 y.o. male who presents for the following: Annual Exam (No concerns. ).  General skin examination Location:  Duration:  Quality:  Associated Signs/Symptoms: Modifying Factors:  Severity:  Timing: Context:   Objective  Well appearing patient in no apparent distress; mood and affect are within normal limits. Scalp General skin examination done, no atypical pigmented lesions or nonmelanoma skin cancer.  Chest - Medial (Center) Smooth red dermal 2 mm papule  Left Lower Back, Mid Back Soft pink partially compressible 6 mm dermal papules  Left Forehead Gritty 4 mm pink crust    A full examination was performed including scalp, head, eyes, ears, nose, lips, neck, chest, axillae, abdomen, back, buttocks, bilateral upper extremities, bilateral lower extremities, hands, feet, fingers, toes, fingernails, and toenails. All findings within normal limits unless otherwise noted below.   Assessment & Plan    Encounter for screening for malignant neoplasm of skin Scalp  Annual skin examination, encouraged to self examine with spouse twice annually.  Continued ultraviolet protection.  Hemangioma of skin Chest - Medial (Center)  No intervention necessary  Neurofibroma (2) Left Lower Back; Mid Back  Leave if stable  Solar keratosis Left Forehead  Destruction of lesion - Left Forehead Complexity: simple   Destruction method: cryotherapy   Informed consent: discussed and consent obtained   Lesion destroyed using liquid nitrogen: Yes   Cryotherapy cycles:  3 Outcome: patient tolerated procedure well with no complications        I, Lavonna Monarch, MD, have reviewed all documentation for this visit.  The documentation on 12/18/21 for the exam, diagnosis, procedures, and orders are all accurate and complete.

## 2022-12-21 ENCOUNTER — Ambulatory Visit: Payer: BC Managed Care – PPO | Admitting: Dermatology
